# Patient Record
Sex: Male | Born: 1960 | Race: White | Hispanic: No | Marital: Single | Smoking: Current every day smoker
Health system: Southern US, Community
[De-identification: ages and names within clinical notes are randomized; demographics above are authoritative.]

## PROBLEM LIST (undated history)

## (undated) DIAGNOSIS — I10 Essential (primary) hypertension: Secondary | ICD-10-CM

---

## 2015-06-14 ENCOUNTER — Emergency Department
Admission: EM | Admit: 2015-06-14 | Discharge: 2015-06-15 | Disposition: A | Discharge status: Home / Self Care | Payer: Self-pay | Attending: Emergency Medicine | Admitting: Emergency Medicine

## 2015-06-14 DIAGNOSIS — L03115 Cellulitis of right lower limb: Secondary | ICD-10-CM | POA: Insufficient documentation

## 2015-06-14 DIAGNOSIS — I10 Essential (primary) hypertension: Secondary | ICD-10-CM | POA: Insufficient documentation

## 2015-06-14 DIAGNOSIS — F17200 Nicotine dependence, unspecified, uncomplicated: Secondary | ICD-10-CM | POA: Insufficient documentation

## 2015-06-14 MED ORDER — CEPHALEXIN 500 MG PO CAPS: 500.0000 mg | ORAL_CAPSULE | Freq: Two times a day (BID) | ORAL | Status: AC

## 2015-06-14 MED ORDER — CEPHALEXIN 500 MG PO CAPS
500.0000 mg | ORAL_CAPSULE | Freq: Once | ORAL | Status: AC
  Administered 2015-06-14: 500 mg via ORAL
  Filled 2015-06-14: qty 1

## 2015-06-14 NOTE — ED Provider Notes (Signed)
[facility] Emergency Department Provider Note  ____________________________________________  Time seen: On arrival  I have reviewed the triage vital signs and the nursing notes.   HISTORY  Chief Complaint Wound Infection    HPI Ricardo Reeves is a 55 y.o. male presents with pain and redness to his anterior right thigh. He thinks he may have been bitten by a spider or a bug. He first noted pain and erythema yesterday and seems of gotten worse. He denies fevers or chills. No history of similar.    No past medical history on file.  There are no active problems to display for this patient.   No past surgical history on file.  Current Outpatient Rx  Name  Route  Sig  Dispense  Refill  . cephALEXin (KEFLEX) 500 MG capsule   Oral   Take 1 capsule (500 mg total) by mouth 2 (two) times daily.   14 capsule   0     Allergies Review of patient's allergies indicates no known allergies.  No family history on file.  Social History  denies alcohol use, denies smoking to me  Review of Systems  Constitutional: Negative for fever.       Skin: As above    ____________________________________________   PHYSICAL EXAM:  VITAL SIGNS: ED Triage Vitals  Enc Vitals Group     BP 06/14/15 2140 144/80 mmHg     Pulse Rate 06/14/15 2140 101     Resp 06/14/15 2140 18     Temp 06/14/15 2140 98 F (36.7 C)     Temp Source 06/14/15 2140 Oral     SpO2 06/14/15 2140 96 %     Weight 06/14/15 2140 185 lb (83.915 kg)     Height 06/14/15 2140  (1.88 m)     Head Cir --      Peak Flow --      Pain Score 06/14/15 2140 7     Pain Loc --      Pain Edu? --      Excl. in GC? --      Constitutional: Alert and oriented. Well appearing and in no distress. Eyes: Conjunctivae are normal.  ENT   Head: Normocephalic and atraumatic.   Mouth/Throat: Mucous membranes are moist. Cardiovascular: Normal rate, regular rhythm. Heart rate 88 my  exam Respiratory: Normal respiratory effort without tachypnea nor retractions.   Musculoskeletal: Nontender with normal range of motion in all extremities. Neurologic:  Normal speech and language.  Skin:  Skin is warm, dry and intact. Patient with area of erythema to the anterior right thigh approximately 8 x 8 cm. Consistent with cellulitis, no fluctuance Psychiatric: Mood and affect are normal. Patient exhibits appropriate insight and judgment.  ____________________________________________    LABS (pertinent positives/negatives)  Labs Reviewed - No data to display  ____________________________________________     ____________________________________________    RADIOLOGY I have personally reviewed any xrays that were ordered on this patient: None  ____________________________________________   PROCEDURES  Procedure(s) performed: none   ____________________________________________   INITIAL IMPRESSION / ASSESSMENT AND PLAN / ED COURSE  Pertinent labs & imaging results that were available during my care of the patient were reviewed by me and considered in my medical decision making (see chart for details).  Outlined cellulitic area. No evidence of abscess. Keflex 500 mg by mouth given Rx provided. Recommend wound check in 2 days in the emergency department  ____________________________________________   FINAL CLINICAL IMPRESSION(S) / ED DIAGNOSES  Final diagnoses:  Cellulitis of right lower extremity     Jene Everyobert Sabrina Keough, MD 06/14/15 [phone]

## 2015-06-14 NOTE — Discharge Instructions (Signed)

## 2015-06-14 NOTE — ED Notes (Signed)
Pt in with red swollen are to right anterior thigh unsure of cause.

## 2015-06-15 MED ORDER — BACITRACIN-NEOMYCIN-POLYMYXIN 400-5-5000 EX OINT
TOPICAL_OINTMENT | CUTANEOUS | Status: AC
  Filled 2015-06-15: qty 1

## 2015-06-17 ENCOUNTER — Emergency Department: Payer: Self-pay

## 2015-06-17 ENCOUNTER — Observation Stay
Admission: EM | Admit: 2015-06-17 | Discharge: 2015-06-18 | Disposition: A | Discharge status: Home / Self Care | Payer: Self-pay | Attending: General Surgery | Admitting: General Surgery

## 2015-06-17 ENCOUNTER — Observation Stay: Payer: Self-pay | Admitting: Certified Registered Nurse Anesthetist

## 2015-06-17 ENCOUNTER — Ambulatory Visit: Admit: 2015-06-17 | Payer: Self-pay | Admitting: General Surgery

## 2015-06-17 ENCOUNTER — Encounter
Admission: EM | Disposition: A | Discharge status: Home / Self Care | Payer: Self-pay | Source: Home / Self Care | Attending: Emergency Medicine

## 2015-06-17 ENCOUNTER — Encounter: Payer: Self-pay | Admitting: Emergency Medicine

## 2015-06-17 DIAGNOSIS — L03115 Cellulitis of right lower limb: Secondary | ICD-10-CM | POA: Insufficient documentation

## 2015-06-17 DIAGNOSIS — X58XXXA Exposure to other specified factors, initial encounter: Secondary | ICD-10-CM | POA: Insufficient documentation

## 2015-06-17 DIAGNOSIS — Z79899 Other long term (current) drug therapy: Secondary | ICD-10-CM | POA: Insufficient documentation

## 2015-06-17 DIAGNOSIS — T185XXA Foreign body in anus and rectum, initial encounter: Principal | ICD-10-CM | POA: Insufficient documentation

## 2015-06-17 DIAGNOSIS — F17200 Nicotine dependence, unspecified, uncomplicated: Secondary | ICD-10-CM | POA: Insufficient documentation

## 2015-06-17 DIAGNOSIS — I10 Essential (primary) hypertension: Secondary | ICD-10-CM | POA: Insufficient documentation

## 2015-06-17 DIAGNOSIS — T185XXS Foreign body in anus and rectum, sequela: Secondary | ICD-10-CM

## 2015-06-17 DIAGNOSIS — J449 Chronic obstructive pulmonary disease, unspecified: Secondary | ICD-10-CM | POA: Insufficient documentation

## 2015-06-17 DIAGNOSIS — N529 Male erectile dysfunction, unspecified: Secondary | ICD-10-CM | POA: Insufficient documentation

## 2015-06-17 HISTORY — PX: FOREIGN BODY REMOVAL RECTAL: SHX5275

## 2015-06-17 HISTORY — DX: Essential (primary) hypertension: I10

## 2015-06-17 SURGERY — REMOVAL, FOREIGN BODY, RECTUM
Anesthesia: General | Site: Rectum | Wound class: Dirty or Infected

## 2015-06-17 MED ORDER — CEFAZOLIN SODIUM-DEXTROSE 2-4 GM/100ML-% IV SOLN
2.0000 g | Freq: Three times a day (TID) | INTRAVENOUS | Status: DC
  Administered 2015-06-17 – 2015-06-18 (×2): 2 g via INTRAVENOUS
  Filled 2015-06-17 (×4): qty 100

## 2015-06-17 MED ORDER — FENTANYL CITRATE (PF) 100 MCG/2ML IJ SOLN: 25.0000 ug | INTRAMUSCULAR | Status: DC | PRN

## 2015-06-17 MED ORDER — ONDANSETRON HCL 4 MG/2ML IJ SOLN: 4.0000 mg | Freq: Four times a day (QID) | INTRAMUSCULAR | Status: DC | PRN

## 2015-06-17 MED ORDER — MORPHINE SULFATE (PF) 4 MG/ML IV SOLN: 4.0000 mg | INTRAVENOUS | Status: DC | PRN

## 2015-06-17 MED ORDER — MIDAZOLAM HCL 2 MG/2ML IJ SOLN
INTRAMUSCULAR | Status: DC | PRN
  Administered 2015-06-17: 2 mg via INTRAVENOUS

## 2015-06-17 MED ORDER — PANTOPRAZOLE SODIUM 40 MG IV SOLR
40.0000 mg | Freq: Every day | INTRAVENOUS | Status: DC
  Administered 2015-06-17: 40 mg via INTRAVENOUS
  Filled 2015-06-17: qty 40

## 2015-06-17 MED ORDER — SODIUM CHLORIDE 0.9 % IR SOLN
Status: DC | PRN
  Administered 2015-06-17: 500 mL

## 2015-06-17 MED ORDER — GLYCOPYRROLATE 0.2 MG/ML IJ SOLN
INTRAMUSCULAR | Status: DC | PRN
  Administered 2015-06-17: 0.2 mg via INTRAVENOUS

## 2015-06-17 MED ORDER — HYDRALAZINE HCL 20 MG/ML IJ SOLN: 10.0000 mg | INTRAMUSCULAR | Status: DC | PRN

## 2015-06-17 MED ORDER — PROPOFOL 10 MG/ML IV BOLUS
INTRAVENOUS | Status: DC | PRN
  Administered 2015-06-17: 180 mg via INTRAVENOUS

## 2015-06-17 MED ORDER — ONDANSETRON HCL 4 MG/2ML IJ SOLN
INTRAMUSCULAR | Status: DC | PRN
  Administered 2015-06-17: 4 mg via INTRAVENOUS

## 2015-06-17 MED ORDER — ONDANSETRON HCL 4 MG/2ML IJ SOLN: 4.0000 mg | Freq: Once | INTRAMUSCULAR | Status: DC | PRN

## 2015-06-17 MED ORDER — ONDANSETRON 8 MG PO TBDP: 4.0000 mg | ORAL_TABLET | Freq: Four times a day (QID) | ORAL | Status: DC | PRN

## 2015-06-17 MED ORDER — LIDOCAINE HCL (CARDIAC) 20 MG/ML IV SOLN
INTRAVENOUS | Status: DC | PRN
  Administered 2015-06-17: 100 mg via INTRAVENOUS

## 2015-06-17 MED ORDER — DIPHENHYDRAMINE HCL 50 MG/ML IJ SOLN: 25.0000 mg | Freq: Four times a day (QID) | INTRAMUSCULAR | Status: DC | PRN

## 2015-06-17 MED ORDER — KETAMINE HCL 50 MG/ML IJ SOLN
INTRAMUSCULAR | Status: DC | PRN
  Administered 2015-06-17: 30 mg via INTRAVENOUS

## 2015-06-17 MED ORDER — DIPHENHYDRAMINE HCL 25 MG PO CAPS: 25.0000 mg | ORAL_CAPSULE | Freq: Four times a day (QID) | ORAL | Status: DC | PRN

## 2015-06-17 MED ORDER — SODIUM CHLORIDE 0.9 % IV SOLN
INTRAVENOUS | Status: DC
  Administered 2015-06-17: 21:00:00 via INTRAVENOUS

## 2015-06-17 MED ORDER — FENTANYL CITRATE (PF) 100 MCG/2ML IJ SOLN
INTRAMUSCULAR | Status: DC | PRN
  Administered 2015-06-17: 100 ug via INTRAVENOUS

## 2015-06-17 SURGICAL SUPPLY — 23 items
BRIEF STRETCH MATERNITY 2XLG (MISCELLANEOUS) IMPLANT
CANISTER SUCT 1200ML W/VALVE (MISCELLANEOUS) IMPLANT
DRAPE LAPAROTOMY 100X77 ABD (DRAPES) ×4 IMPLANT
ELECT REM PT RETURN 9FT ADLT (ELECTROSURGICAL) ×4
ELECTRODE REM PT RTRN 9FT ADLT (ELECTROSURGICAL) ×2 IMPLANT
GLOVE BIO SURGEON STRL SZ7.5 (GLOVE) ×16 IMPLANT
GOWN STRL REUS W/ TWL LRG LVL3 (GOWN DISPOSABLE) ×4 IMPLANT
GOWN STRL REUS W/TWL LRG LVL3 (GOWN DISPOSABLE) ×4
HARMONIC SCALPEL FOCUS (MISCELLANEOUS) ×4 IMPLANT
KIT RM TURNOVER STRD PROC AR (KITS) IMPLANT
LABEL OR SOLS (LABEL) ×4 IMPLANT
NEEDLE HYPO 25X1 1.5 SAFETY (NEEDLE) ×4 IMPLANT
NS IRRIG 500ML POUR BTL (IV SOLUTION) ×4 IMPLANT
PACK BASIN MINOR ARMC (MISCELLANEOUS) ×4 IMPLANT
PAD ABD DERMACEA PRESS 5X9 (GAUZE/BANDAGES/DRESSINGS) IMPLANT
PAD TELFA 2X3 NADH STRL (GAUZE/BANDAGES/DRESSINGS) ×4 IMPLANT
SOL PREP PVP 2OZ (MISCELLANEOUS) ×4
SOLUTION PREP PVP 2OZ (MISCELLANEOUS) ×2 IMPLANT
SURGILUBE 2OZ TUBE FLIPTOP (MISCELLANEOUS) ×4 IMPLANT
SUT CHROMIC 3 0 SH 27 (SUTURE) IMPLANT
SWABSTK COMLB BENZOIN TINCTURE (MISCELLANEOUS) ×4 IMPLANT
SYR BULB EAR ULCER 3OZ GRN STR (SYRINGE) ×4 IMPLANT
SYRINGE 10CC LL (SYRINGE) ×4 IMPLANT

## 2015-06-17 NOTE — Progress Notes (Signed)
Patient inadvertently taken to 2C from the emergency department before going to the operating room  Orders placed  To the OR soon as OR available for retrieval of foreign body  Ricarda Frameharles Eliani Leclere, MD [facility] General Surgeon [facility]

## 2015-06-17 NOTE — Anesthesia Preprocedure Evaluation (Signed)
Anesthesia Evaluation  Patient identified by MRN, date of birth, ID band Patient awake    Airway Mallampati: II       Dental  (+) Teeth Intact   Pulmonary COPD, Current Smoker,     + decreased breath sounds      Cardiovascular Exercise Tolerance: Good hypertension, Pt. on medications  Rhythm:Regular Rate:Normal     Neuro/Psych    GI/Hepatic negative GI ROS, Neg liver ROS,   Endo/Other  negative endocrine ROS  Renal/GU negative Renal ROS     Musculoskeletal negative musculoskeletal ROS (+)   Abdominal Normal abdominal exam  (+)   Peds  Hematology negative hematology ROS (+)   Anesthesia Other Findings   Reproductive/Obstetrics                             Anesthesia Physical Anesthesia Plan  ASA: II and emergent  Anesthesia Plan: General   Post-op Pain Management:    Induction: Intravenous  Airway Management Planned: LMA  Additional Equipment:   Intra-op Plan:   Post-operative Plan: Extubation in OR  Informed Consent: I have reviewed the patients History and Physical, chart, labs and discussed the procedure including the risks, benefits and alternatives for the proposed anesthesia with the patient or authorized representative who has indicated his/her understanding and acceptance.     Plan Discussed with: CRNA  Anesthesia Plan Comments:         Anesthesia Quick Evaluation

## 2015-06-17 NOTE — ED Provider Notes (Signed)
[facility] Emergency Department Provider Note  ____________________________________________  Time seen: Approximately 5:57 PM  I have reviewed the triage vital signs and the nursing notes.   HISTORY  Chief Complaint Foreign Body in Rectum    HPI Ricardo Reeves is a 55 y.o. male reports that about 3 days ago he had a vibrator get lost in his rectum. He has erectile dysfunction and they're trying to stimulate his prostate for intercourse, but it was lost and he was unable to retrieve it.  He reports he has an achy feeling in the rectal area and feels as though he needs to defecate but cannot. He is able to eat drink and denies any abdominal pain. He does have an area of cellulitis on his right leg, he is on antibiotics and this is getting better.  Denies fevers or chills. No vomiting.   Past Medical History  Diagnosis Date  . Hypertension     Patient Active Problem List   Diagnosis Date Noted  . Rectal foreign body 06/17/2015    History reviewed. No pertinent past surgical history.  Current Outpatient Rx  Name  Route  Sig  Dispense  Refill  . cephALEXin (KEFLEX) 500 MG capsule   Oral   Take 1 capsule (500 mg total) by mouth 2 (two) times daily.   14 capsule   0     Allergies Review of patient's allergies indicates no known allergies.  No family history on file.  Social History Social History  Substance Use Topics  . Smoking status: Current Every Day Smoker  . Smokeless tobacco: None  . Alcohol Use: No    Review of Systems Constitutional: No fever/chills Eyes: No visual changes. ENT: No sore throat. Cardiovascular: Denies chest pain. Respiratory: Denies shortness of breath. Gastrointestinal: No abdominal pain.  No nausea, no vomiting.  No diarrhea.  Feels constipated, unable to pass foreign body. Genitourinary: Negative for dysuria. Musculoskeletal: Negative for back pain. Skin: Negative for rash except as noted which he reports  is healing quickly. Neurological: Negative for headaches, focal weakness or numbness.  10-point ROS otherwise negative.  ____________________________________________   PHYSICAL EXAM:  VITAL SIGNS: ED Triage Vitals  Enc Vitals Group     BP 06/17/15 1650 144/82 mmHg     Pulse Rate 06/17/15 1650 102     Resp 06/17/15 1650 16     Temp 06/17/15 1650 97.7 F (36.5 C)     Temp Source 06/17/15 1650 Oral     SpO2 06/17/15 1650 97 %     Weight 06/17/15 1650 180 lb (81.647 kg)     Height 06/17/15 1650  (1.88 m)     Head Cir --      Peak Flow --      Pain Score 06/17/15 1651 4     Pain Loc --      Pain Edu? --      Excl. in GC? --    Constitutional: Alert and oriented. Well appearing and in no acute distress. Eyes: Conjunctivae are normal. PERRL. EOMI. Head: Atraumatic. Nose: No congestion/rhinnorhea. Mouth/Throat: Mucous membranes are moist.   Neck: No stridor.   Cardiovascular: Normal rate, regular rhythm. Grossly normal heart sounds.  Good peripheral circulation. Respiratory: Normal respiratory effort.  No retractions. Lungs CTAB. Gastrointestinal: Soft and nontender. No distention. No abdominal bruits. No CVA tenderness. Rectal exam: I am unable to palpate the foreign body by digital exam. The perineum is normal. The rectum itself is nontender. Neurologic:  Normal speech  and language. No gross focal neurologic deficits are appreciated. No gait instability. Skin:  Skin is warm, dry and intact. No rash noted. The right upper thigh anteriorly has an area of erythema, which has withdrawn compared to the outlined cellulitis. Appears no complication, and appears the erythema is improving as compared with previous skin marking. Psychiatric: Mood and affect are normal. Speech and behavior are normal.  ____________________________________________   LABS (all labs ordered are listed, but only abnormal results are displayed)  Labs Reviewed - No data to  display ____________________________________________  EKG   ____________________________________________  RADIOLOGY  DG Abd 1 View (Final result) Result time: 06/17/15 17:27:51   Procedure changed from DG Abd 2 Views      Final result by Rad Results In Interface (06/17/15 17:27:51)   Narrative:   CLINICAL DATA: Foreign body (vibrator) in rectum for 2 days, initial encounter  EXAM: ABDOMEN - 1 VIEW  COMPARISON: None.  FINDINGS: Scattered large and small bowel gas is noted. Fecal material is noted throughout the colon consistent with a degree of constipation. Radiopaque foreign body is noted in the rectosigmoid region consistent with the patient's given clinical history. Degenerative changes of the lumbar spine are noted.  IMPRESSION: Foreign body consistent with the patient's given clinical history in the rectosigmoid region.   Electronically Signed By: Alcide CleverMark Lukens M.D. On: 06/17/2015 17:27    ____________________________________________   PROCEDURES  Procedure(s) performed: None  Critical Care performed: No  ____________________________________________   INITIAL IMPRESSION / ASSESSMENT AND PLAN / ED COURSE  Pertinent labs & imaging results that were available during my care of the patient were reviewed by me and considered in my medical decision making (see chart for details).  Rectal foreign body. I am unable to retrieve it digitally, placed a consult with Dr. Excell Seltzerooper. No evidence of complication, perforation, peritonitis noted by clinical history or exam.  Patient seen by Dr. Tonita CongWoodham, admitting to the operating room. ____________________________________________   FINAL CLINICAL IMPRESSION(S) / ED DIAGNOSES  Final diagnoses:  Rectal foreign body, initial encounter      Sharyn CreamerMark Avrielle Fry, MD 06/17/15 [phone]

## 2015-06-17 NOTE — Transfer of Care (Signed)
Immediate Anesthesia Transfer of Care Note  Patient: Ricardo Reeves  Procedure(s) Performed: Procedure(s): REMOVAL FOREIGN BODY RECTAL (N/A)  Patient Location: PACU  Anesthesia Type:General  Level of Consciousness: awake and patient cooperative  Airway & Oxygen Therapy: Patient Spontanous Breathing and Patient connected to nasal cannula oxygen  Post-op Assessment: Report given to RN and Post -op Vital signs reviewed and stable  Post vital signs: Reviewed and stable  Last Vitals:  Filed Vitals:   06/17/15 2039 06/17/15 2051  BP: 138/89 156/76  Pulse: 81 76  Temp:  36.6 C  Resp: 18 18    Complications: No apparent anesthesia complications

## 2015-06-17 NOTE — Brief Op Note (Signed)
06/17/2015  10:09 PM  PATIENT:  Lorin Glassoger XXXGatlin  55 y.o. male  PRE-OPERATIVE DIAGNOSIS:  foreign body  POST-OPERATIVE DIAGNOSIS:  Same  PROCEDURE:  Procedure(s): REMOVAL FOREIGN BODY RECTAL (N/A) HEMORRHOIDECTOMY (N/A)  SURGEON:  Surgeon(s) and Role:    * Ricarda Frameharles Chany Woolworth, MD - Primary  PHYSICIAN ASSISTANT:   ASSISTANTS: none   ANESTHESIA:   general  EBL:     BLOOD ADMINISTERED:none  DRAINS: none   LOCAL MEDICATIONS USED:  NONE  SPECIMEN:  No Specimen  DISPOSITION OF SPECIMEN:  N/A  COUNTS:  YES  TOURNIQUET:  * No tourniquets in log *  DICTATION: .Dragon Dictation  PLAN OF CARE: Admit for overnight observation  PATIENT DISPOSITION:  PACU - hemodynamically stable.   Delay start of Pharmacological VTE agent (>24hrs) due to surgical blood loss or risk of bleeding: not applicable

## 2015-06-17 NOTE — ED Notes (Signed)
Pt here with vibrator in rectum. Has been there the past 2 days. Pt denies any bleeding from rectum.

## 2015-06-17 NOTE — Anesthesia Procedure Notes (Signed)
Procedure Name: LMA Insertion Date/Time: 06/17/2015 8:50 PM Performed by: Shirlee LimerickMARION, Evola Hollis Pre-anesthesia Checklist: Patient identified, Emergency Drugs available, Suction available and Patient being monitored Patient Re-evaluated:Patient Re-evaluated prior to inductionOxygen Delivery Method: Circle system utilized Preoxygenation: Pre-oxygenation with 100% oxygen Intubation Type: IV induction LMA Size: 5.0 Number of attempts: 1 Placement Confirmation: breath sounds checked- equal and bilateral Tube secured with: Tape Dental Injury: Teeth and Oropharynx as per pre-operative assessment

## 2015-06-17 NOTE — ED Notes (Signed)
Consent form signed and placed on chart.

## 2015-06-17 NOTE — Op Note (Signed)
   Pre-operative Diagnosis: Rectal foreign body  Post-operative Diagnosis: Same  Surgeon: [name]   Assistants: None  Anesthesia: General LMA anesthesia  ASA Class: 2  Surgeon: Ricarda Frameharles King Pinzon, MD FACS  Anesthesia: Gen. with endotracheal tube  Assistant: None  Procedure Details  The patient was seen again in the Holding Room. The benefits, complications, treatment options, and expected outcomes were discussed with the patient. The risks of bleeding, infection, inability to retrieve foreign body,  bowel injury, any of which could require further surgery were reviewed with the patient.   The patient was taken to Operating Room, identified as Ricardo Reeves and the procedure verified.  A Time Out was held and the above information confirmed.  Prior to the induction of general anesthesia, antibiotic prophylaxis was administered. VTE prophylaxis was in place. General endotracheal anesthesia was then administered and tolerated well. After the induction, the perineum was prepped with Betadine and draped in the sterile fashion. The patient was positioned in the high lithotomy position.  Procedure began with a digital rectal exam. With the single digits the retained foreign body which was consistent with a vibrator was easily palpated. The rectum was manually dilated until the rectum could easily accept 3 fingers. With 3 fingers within the rectal space the distal tip of the foreign body was able be grasped and pulled into the more distal rectum. This allowed for the entirety of the base of the foreign body to be grasped and removed.  A repeat digital rectal exam was performed which showed no evidence of prolapse no evidence of bleed and no evidence of any complication.  This completed the procedure. All counts are correct. The procedure there were no immediate, complications. He was awoken from general anesthesia and transferred to the PACU in good condition.  Findings: Retained rectal  foreign body   Estimated Blood Loss: None         Drains: None         Specimens: Foreign body          Complications: None                  Condition: Good   Ricarda Frameharles Nealy Karapetian, MD, FACS

## 2015-06-17 NOTE — H&P (Signed)
Patient ID: Ricardo Reeves, male   DOB: 11-16-1960, 55 y.o.   MRN: [id number]  CC: Rectal Foreign Body  HPI Ricardo Reeves is a 55 y.o. male who presents to emergency department with a 2 day history of retained rectal foreign body. Patient states a vibrator that was in place by his significant other and attempt to overcome erectile dysfunction. Control of the device was lost during intercourse. Patient states nothing like this is ever happened to him before. He denies any fevers, chills, nausea, vomiting. He's had the urge for bowel movement but has been unable to have one since the object was lost. Otherwise in his usual state of good health.  HPI  Past Medical History  Diagnosis Date  . Hypertension     History reviewed. No pertinent past surgical history.  No family history on file.  Social History Social History  Substance Use Topics  . Smoking status: Current Every Day Smoker  . Smokeless tobacco: None  . Alcohol Use: No    No Known Allergies  No current facility-administered medications for this encounter.   Current Outpatient Prescriptions  Medication Sig Dispense Refill  . cephALEXin (KEFLEX) 500 MG capsule Take 1 capsule (500 mg total) by mouth 2 (two) times daily. 14 capsule 0     Review of Systems A Multi-point review of systems was asked and was negative except for the findings documented in the history of present illness  Physical Exam Blood pressure 144/82, pulse 102, temperature 97.7 F (36.5 C), temperature source Oral, resp. rate 16, height  (1.88 m), weight 81.647 kg (180 lb), SpO2 97 %. CONSTITUTIONAL: No acute distress. EYES: Pupils are equal, round, and reactive to light, Sclera are non-icteric. EARS, NOSE, MOUTH AND THROAT: The oropharynx is clear. The oral mucosa is pink and moist. Hearing is intact to voice. LYMPH NODES:  Lymph nodes in the neck are normal. RESPIRATORY:  Lungs are clear. There is normal respiratory effort, with equal breath sounds  bilaterally, and without pathologic use of accessory muscles. CARDIOVASCULAR: Heart is regular without murmurs, gallops, or rubs. GI: The abdomen is  soft, nontender, and nondistended. There are no palpable masses. There is no hepatosplenomegaly. There are normal bowel sounds in all quadrants. GU: Rectal shows a barely palpable foreign body that is unable to grasp..   MUSCULOSKELETAL: Normal muscle strength and tone. No cyanosis or edema.   SKIN: Turgor is good and there are no pathologic skin lesions or ulcers. NEUROLOGIC: Motor and sensation is grossly normal. Cranial nerves are grossly intact. PSYCH:  Oriented to person, place and time. Affect is normal.  Data Reviewed X-ray reviewed which shows a retained metallic foreign body within the rectum no evidence of free air but significant constipation proximal I have personally reviewed the patient's imaging, laboratory findings and medical records.    Assessment    Rectal foreign body    Plan    55 year old male with a rectal foreign body this been in place for 2 days. Discussed that the safest way to remove this is in the operating room under sedation. Also discussed that on a rare occasion retained foreign bodies for this at the time can cause damage to the colon requiring further interventions her antibodies. Patient voiced understanding and is very embarrassed by this process. Plan to take the operative room for the emergency department for removal of rectal foreign body and then observation. All questions were answered to the patient's satisfaction.     Time spent with the patient was  30 minutes, with more than 50% of the time spent in face-to-face education, counseling and care coordination.     Ricarda Frameharles Othell Jaime, MD FACS General Surgeon 06/17/2015, 7:42 PM

## 2015-06-17 NOTE — ED Notes (Signed)
Report given to Brazos CountryMarcella, Charity fundraiserN.

## 2015-06-18 ENCOUNTER — Encounter: Payer: Self-pay | Admitting: General Surgery

## 2015-06-18 NOTE — Progress Notes (Signed)
Verified with Dr. Tonita CongWoodham that patient is allowed to go outside to smoke; reinforced with patient and Dr. Tonita CongWoodham that this was a "NO smoking" facility;  Dr. Tonita CongWoodham stated that patient "could go outside and come back in and get one last dose of antibiotic and then be discharged home".  Patient A/O X4; steady gait;

## 2015-06-18 NOTE — Anesthesia Postprocedure Evaluation (Signed)
Anesthesia Post Note  Patient: Ricardo Reeves  Procedure(s) Performed: Procedure(s) (LRB): REMOVAL FOREIGN BODY RECTAL (N/A)  Patient location during evaluation: PACU Anesthesia Type: General Level of consciousness: awake Pain management: satisfactory to patient Vital Signs Assessment: post-procedure vital signs reviewed and stable Respiratory status: spontaneous breathing Cardiovascular status: blood pressure returned to baseline Anesthetic complications: no    Last Vitals:  Filed Vitals:   06/18/15 0117 06/18/15 0119  BP:  143/82  Pulse: 84 80  Temp:  36.7 C  Resp:  18    Last Pain:  Filed Vitals:   06/18/15 0119  PainSc: 0-No pain                 VAN STAVEREN,Tonica Brasington

## 2015-06-18 NOTE — Progress Notes (Signed)
Patient returned to floor at 0105; A/O X4; no complaints; IVF Saline locked. Windy Carinaurner,Aleeha Boline K, RN 1:12 AM 06/18/2015

## 2015-06-18 NOTE — Discharge Summary (Signed)
Patient ID: Ricardo Reeves MRN: [id number] DOB/AGE: May 29, 1960 55 y.o.  Admit date: 06/17/2015 Discharge date: 06/18/2015  Discharge Diagnoses:  Foreign body  Procedures Performed: Removal of foreign body  Discharged Condition: good  Hospital Course: Foreign body removed in OR. Discharged home next morning. No issues.  Discharge Orders: Home  Disposition: 01-Home or Self Care  Discharge Medications:   Medication List    TAKE these medications        cephALEXin 500 MG capsule  Commonly known as:  KEFLEX  Take 1 capsule (500 mg total) by mouth 2 (two) times daily.     naproxen sodium 220 MG tablet  Commonly known as:  ANAPROX  Take 220 mg by mouth 2 (two) times daily as needed.         Follwup: Follow-up Information    [facility]   Why:  As needed   Contact information:   [address] [place] [id number] [phone]      Signed: Ricarda FrameCharles Makarios Madlock 06/18/2015, 6:40 AM

## 2015-06-18 NOTE — Final Progress Note (Signed)
1 Day Post-Op   Subjective:  Patient without complaint. Wants to go home  Vital signs in last 24 hours: Temp:  [97.1 F (36.2 C)-98.6 F (37 C)] 98 F (36.7 C) (03/31 0409) Pulse Rate:  [69-102] 69 (03/31 0409) Resp:  [13-20] 18 (03/31 0409) BP: (114-156)/(76-105) 140/79 mmHg (03/31 0409) SpO2:  [94 %-100 %] 97 % (03/31 0409) Weight:  [78.608 kg (173 lb 4.8 oz)-81.647 kg (180 lb)] 78.608 kg (173 lb 4.8 oz) (03/30 2051) Last BM Date: 06/14/15  Intake/Output from previous day: 03/30 0701 - 03/31 0700 In: 522 [I.V.:522] Out: 0   GI: soft, non-tender; bowel sounds normal; no masses,  no organomegaly  Lab Results:  CBC No results for input(s): WBC, HGB, HCT, PLT in the last 72 hours. CMP  No results found for: NA, K, CL, CO2, GLUCOSE, BUN, CREATININE, CALCIUM, PROT, ALBUMIN, AST, ALT, ALKPHOS, BILITOT, GFRNONAA, GFRAA PT/INR No results for input(s): LABPROT, INR in the last 72 hours.  Studies/Results: Dg Abd 1 View  06/17/2015  CLINICAL DATA:  Foreign body (vibrator) in rectum for 2 days, initial encounter EXAM: ABDOMEN - 1 VIEW COMPARISON:  None. FINDINGS: Scattered large and small bowel gas is noted. Fecal material is noted throughout the colon consistent with a degree of constipation. Radiopaque foreign body is noted in the rectosigmoid region consistent with the patient's given clinical history. Degenerative changes of the lumbar spine are noted. IMPRESSION: Foreign body consistent with the patient's given clinical history in the rectosigmoid region. Electronically Signed   By: Alcide CleverMark  Lukens M.D.   On: 06/17/2015 17:27    Assessment/Plan: Patient had foreign body removed without issues. Also with resolving right thigh infection. No complaints. Complete home ABX. F/U PRN. Discharge home   Ricarda Frameharles Shakirah Kirkey, MD FACS General Surgeon  06/18/2015

## 2015-06-18 NOTE — Progress Notes (Signed)
  Patient states he is feeling much better  He pointed out an area on his anterior leg that has been being treated with outpatient antibiotics. Cellulitis around a central wound. He states it is much better  Will continue IV ABX while he is here tonight and then continue outpatient oral antibiotics. Will allow him to go to his truck, but he is to return immediately  Likely discharge in AM once tolerates diet  Ricarda Frameharles Asra Gambrel, MD [facility] General Sur[facility]

## 2015-06-18 NOTE — Discharge Instructions (Signed)
Constipation, Adult Constipation is when a person:  Poops (has a bowel movement) less than 3 times a week.  Has a hard time pooping.  Has poop that is dry, hard, or bigger than normal. HOME CARE   Eat foods with a lot of fiber in them. This includes fruits, vegetables, beans, and whole grains such as brown rice.  Avoid fatty foods and foods with a lot of sugar. This includes french fries, hamburgers, cookies, candy, and soda.  If you are not getting enough fiber from food, take products with added fiber in them (supplements).  Drink enough fluid to keep your pee (urine) clear or pale yellow.  Exercise on a regular basis, or as told by your doctor.  Go to the restroom when you feel like you need to poop. Do not hold it.  Only take medicine as told by your doctor. Do not take medicines that help you poop (laxatives) without talking to your doctor first. GET HELP RIGHT AWAY IF:   You have bright red blood in your poop (stool).  Your constipation lasts more than 4 days or gets worse.  You have belly (abdominal) or butt (rectal) pain.  You have thin poop (as thin as a pencil).  You lose weight, and it cannot be explained. MAKE SURE YOU:   Understand these instructions.  Will watch your condition.  Will get help right away if you are not doing well or get worse.   This information is not intended to replace advice given to you by your health care provider. Make sure you discuss any questions you have with your health care provider.   Document Released: 08/23/2007 Document Revised: 03/27/2014 Document Reviewed: 12/16/2012 Elsevier Interactive Patient Education 2016 [employer] Cellulitis Cellulitis is an infection of the skin and the tissue under the skin. The infected area is usually red and tender. This happens most often in the arms and lower legs. HOME CARE   Take your antibiotic medicine as told. Finish the medicine even if you start to feel better.  Keep the  infected arm or leg raised (elevated).  Put a warm cloth on the area up to 4 times per day.  Only take medicines as told by your doctor.  Keep all doctor visits as told. GET HELP IF:  You see red streaks on the skin coming from the infected area.  Your red area gets bigger or turns a dark color.  Your bone or joint under the infected area is painful after the skin heals.  Your infection comes back in the same area or different area.  You have a puffy (swollen) bump in the infected area.  You have new symptoms.  You have a fever. GET HELP RIGHT AWAY IF:   You feel very sleepy.  You throw up (vomit) or have watery poop (diarrhea).  You feel sick and have muscle aches and pains.   This information is not intended to replace advice given to you by your health care provider. Make sure you discuss any questions you have with your health care provider.   Document Released: 08/23/2007 Document Revised: 11/25/2014 Document Reviewed: 05/22/2011 Elsevier Interactive Patient Education [employer]  If thigh boil gets worse, go to Urgent care to get it looked at.

## 2015-06-21 LAB — SURGICAL PATHOLOGY

## 2016-07-25 IMAGING — CR DG ABDOMEN 1V
1 series · 1 of 1 positions shown · non-contrast
Comparison: None.

CLINICAL DATA: Foreign body (vibrator) in rectum for 2 days,
initial encounter

EXAM:
ABDOMEN - 1 VIEW

[dg abd 1 view]
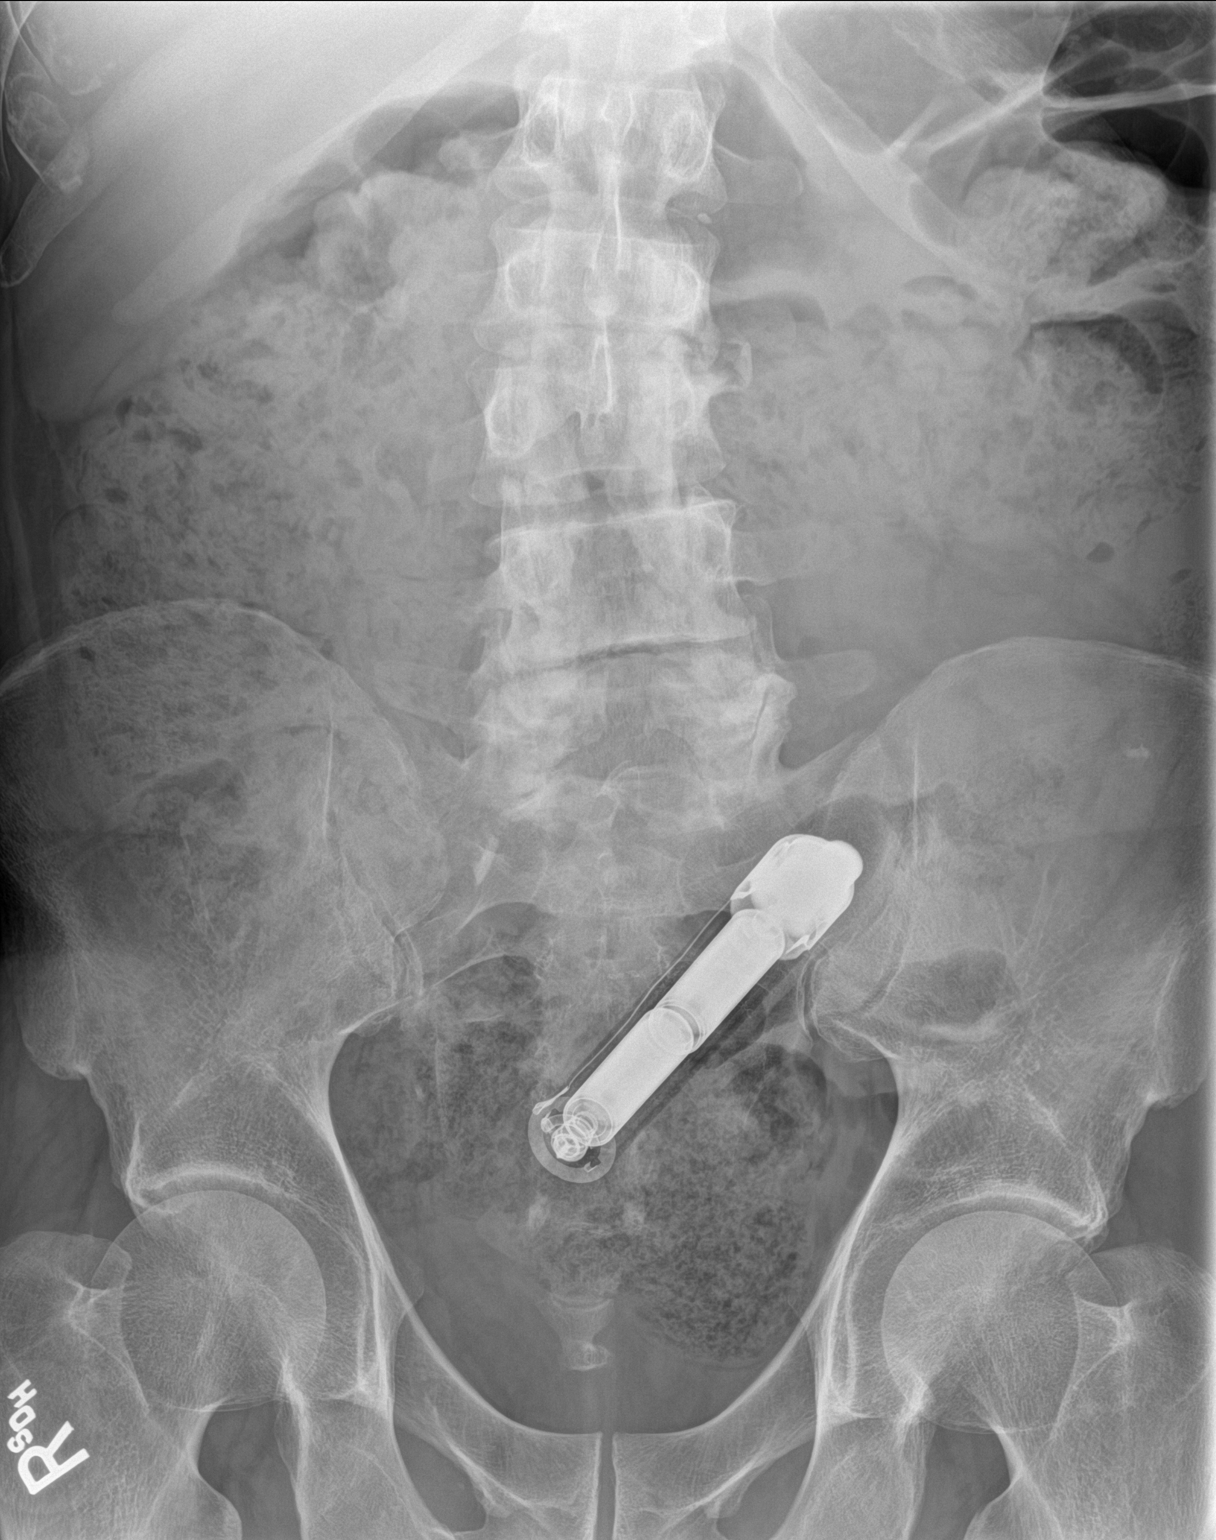

[1 of 1 positions shown; findings below may reference images not displayed]

FINDINGS: Scattered large and small bowel gas is noted. Fecal material is
noted throughout the colon consistent with a degree of constipation.
Radiopaque foreign body is noted in the rectosigmoid region
consistent with the patient's given clinical history. Degenerative
changes of the lumbar spine are noted.
IMPRESSION: Foreign body consistent with the patient's given clinical history in
the rectosigmoid region.
